# Patient Record
Sex: Female | Born: 1945 | Hispanic: Yes | Marital: Married | Smoking: Never smoker
Health system: Southern US, Community
[De-identification: ages and names within clinical notes are randomized; demographics above are authoritative.]

## PROBLEM LIST (undated history)

## (undated) DIAGNOSIS — Z8379 Family history of other diseases of the digestive system: Secondary | ICD-10-CM

## (undated) DIAGNOSIS — R112 Nausea with vomiting, unspecified: Secondary | ICD-10-CM

## (undated) DIAGNOSIS — K219 Gastro-esophageal reflux disease without esophagitis: Secondary | ICD-10-CM

## (undated) DIAGNOSIS — R1013 Epigastric pain: Secondary | ICD-10-CM

## (undated) HISTORY — PX: OTHER SURGICAL HISTORY: SHX169

## (undated) HISTORY — DX: Family history of other diseases of the digestive system: Z83.79

## (undated) HISTORY — PX: CHOLECYSTECTOMY: SHX55

## (undated) HISTORY — DX: Nausea with vomiting, unspecified: R11.2

## (undated) HISTORY — DX: Epigastric pain: R10.13

## (undated) HISTORY — DX: Gastro-esophageal reflux disease without esophagitis: K21.9

---

## 2005-10-31 ENCOUNTER — Emergency Department (HOSPITAL_COMMUNITY): Admission: EM | Admit: 2005-10-31 | Discharge: 2005-10-31 | Payer: Self-pay | Admitting: Emergency Medicine

## 2007-11-06 ENCOUNTER — Ambulatory Visit (HOSPITAL_COMMUNITY): Admission: RE | Admit: 2007-11-06 | Discharge: 2007-11-06 | Payer: Self-pay | Admitting: Family Medicine

## 2008-01-19 ENCOUNTER — Encounter (HOSPITAL_COMMUNITY): Admission: RE | Admit: 2008-01-19 | Discharge: 2008-02-18 | Payer: Self-pay | Admitting: Family Medicine

## 2008-01-19 HISTORY — PX: OTHER SURGICAL HISTORY: SHX169

## 2008-02-01 ENCOUNTER — Ambulatory Visit: Payer: Self-pay | Admitting: Urgent Care

## 2008-10-11 IMAGING — US US ABDOMEN COMPLETE
1 series · 14 of 25 positions shown · non-contrast
Comparison: None

CLINICAL DATA: Epigastric pain, nausea, vomiting

ABDOMEN ULTRASOUND
TECHNIQUE: Complete abdominal ultrasound examination was performed
including evaluation of the liver, gallbladder, bile ducts,
pancreas, kidneys, spleen, IVC, and abdominal aorta.

[Series 1: us abdomen complete · 0.30mm/px · 14 of 62 slices shown]
[im 1/62]
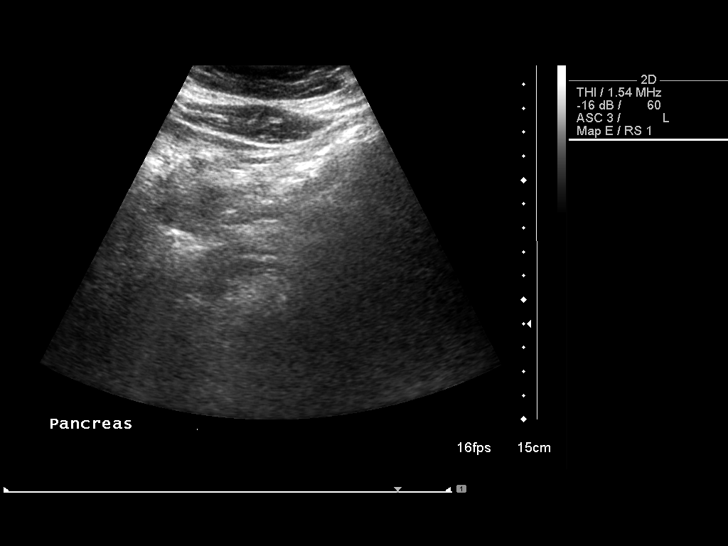
[im 6/62]
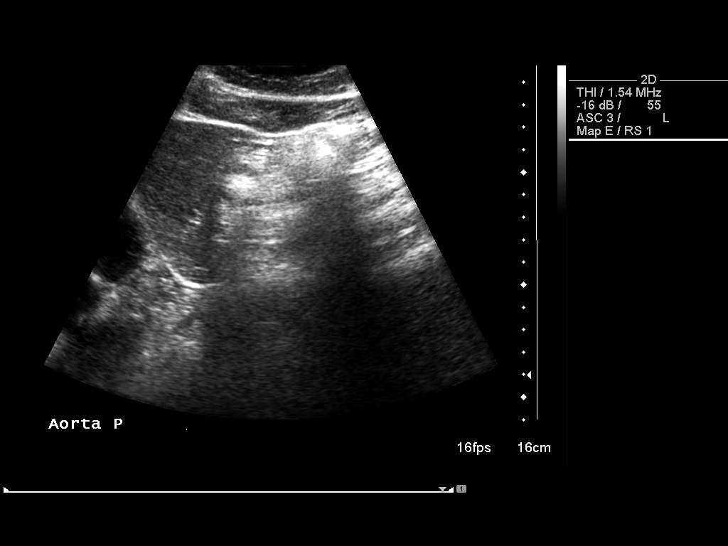
[im 11/62]
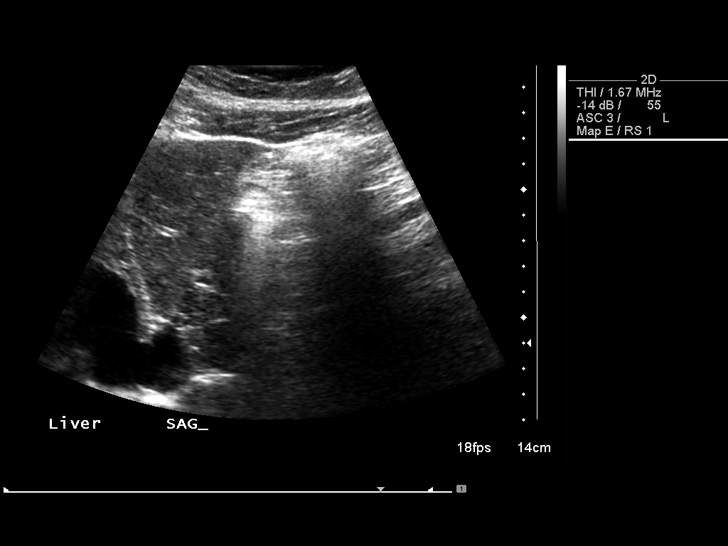
[im 16/62]
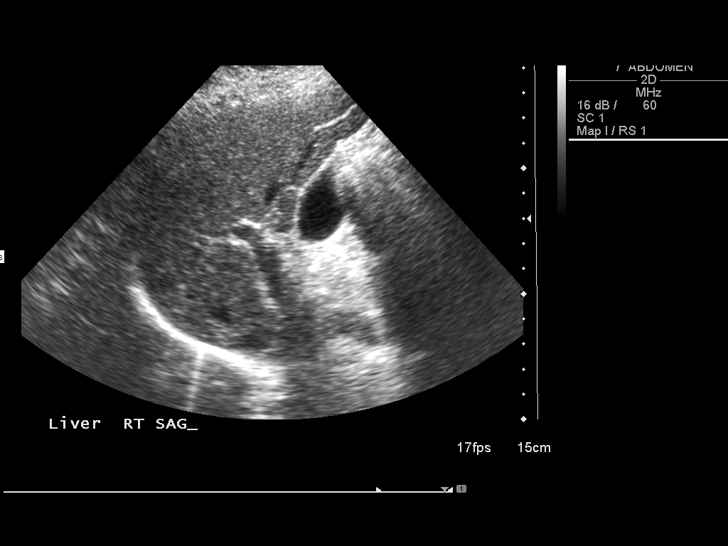
[im 21/62]
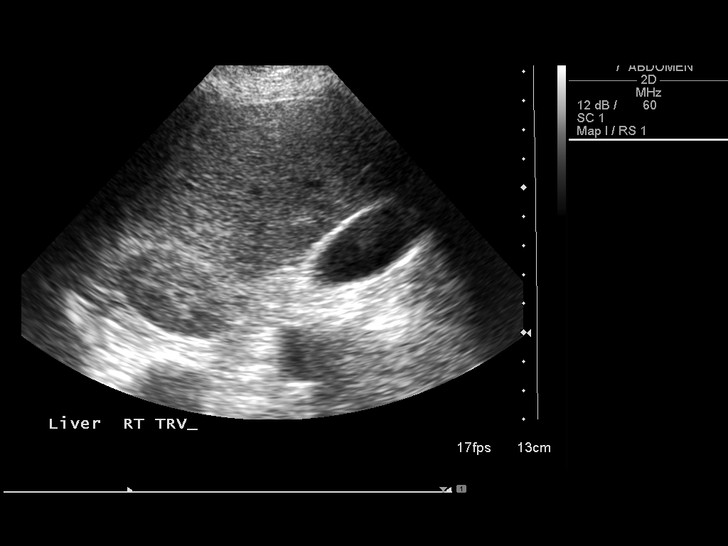
[im 23/62]
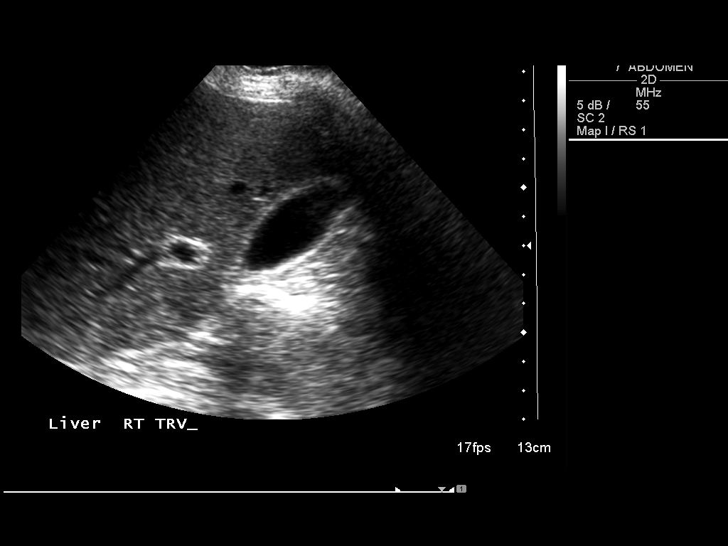
[im 28/62]
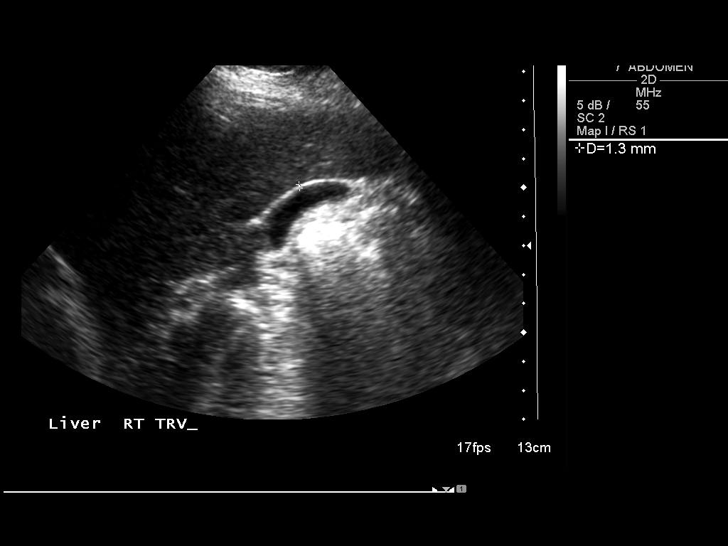
[im 34/62]
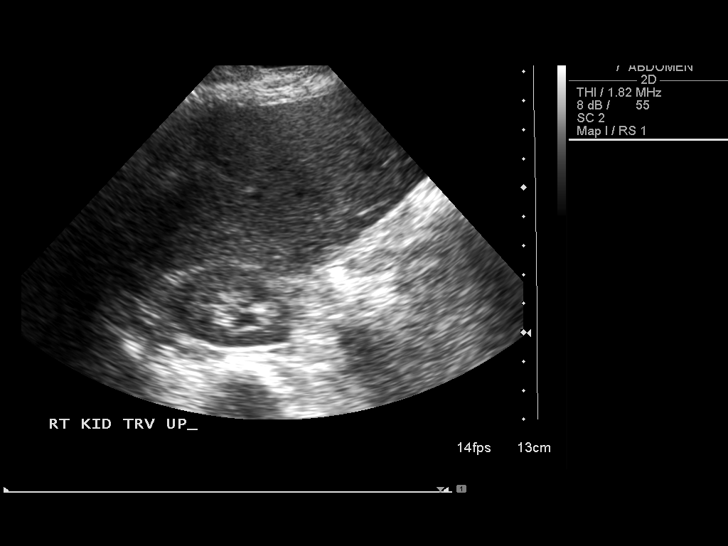
[im 39/62]
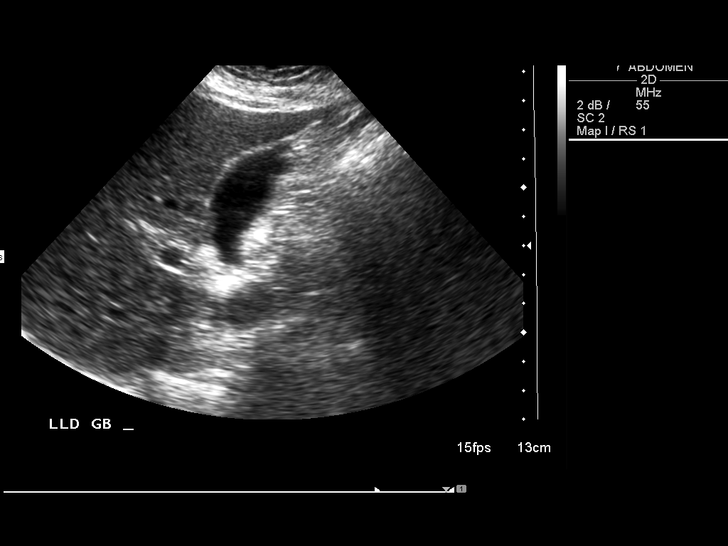
[im 41/62]
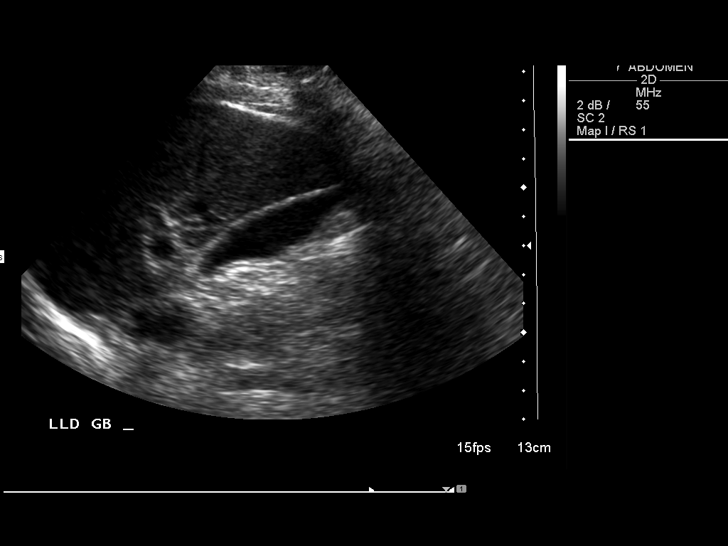
[im 46/62]
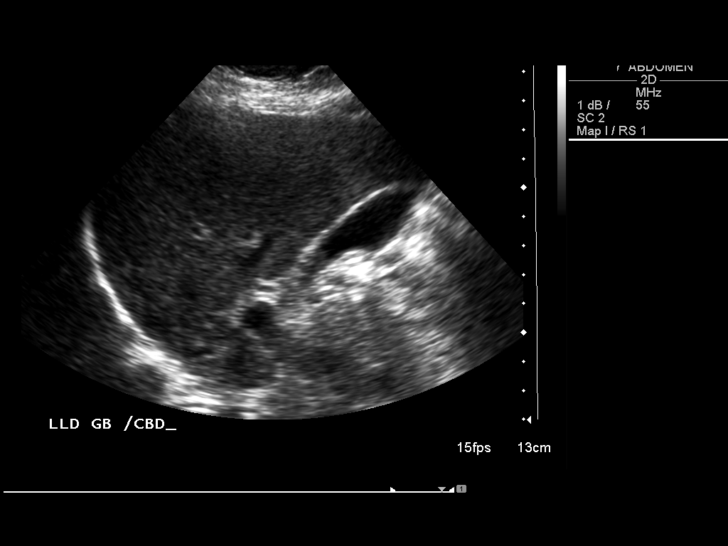
[im 51/62]
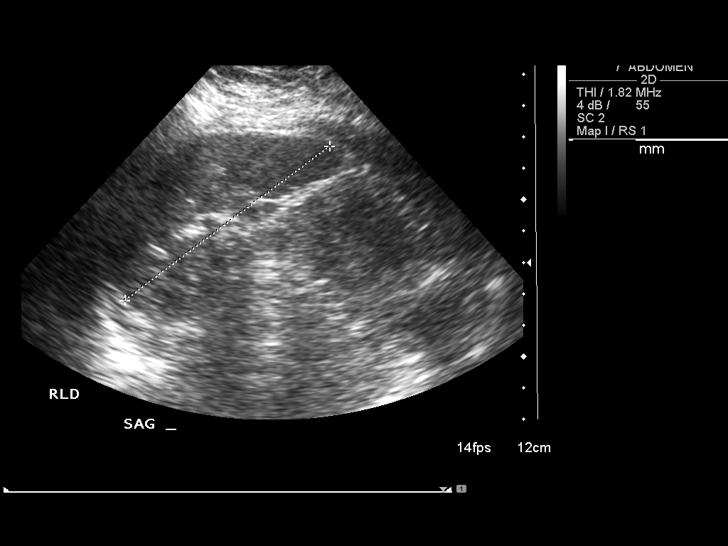
[im 56/62]
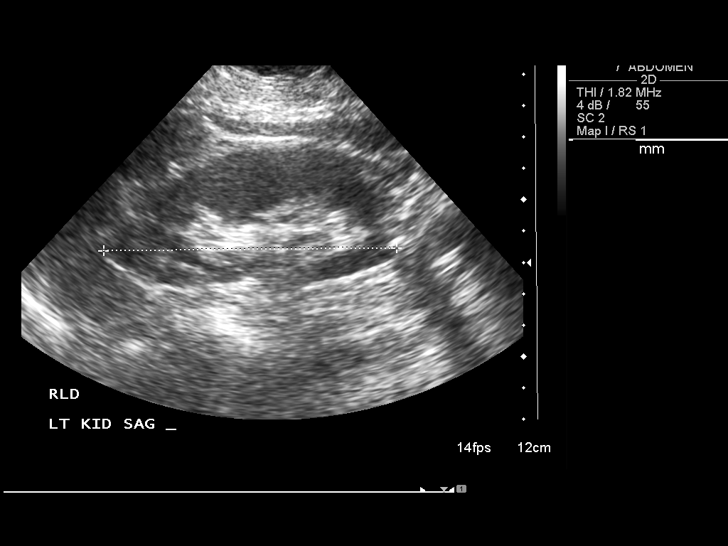
[im 62/62]
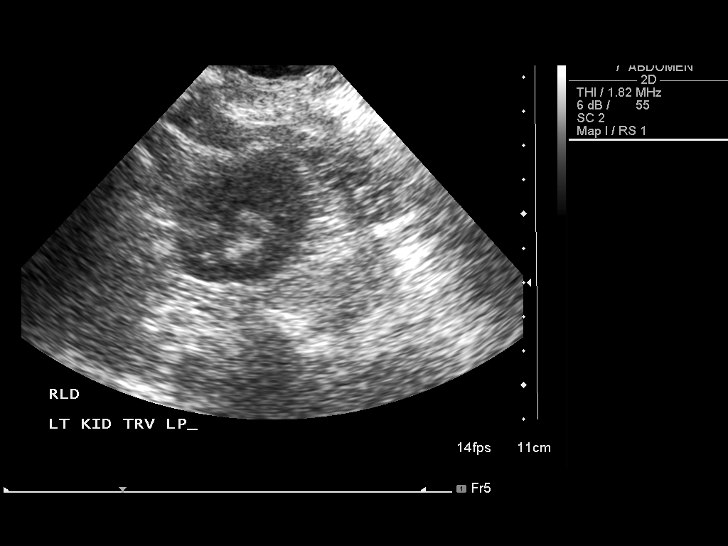

[14 of 25 positions shown; findings below may reference images not displayed]

FINDINGS: Gallbladder normal without stones or wall thickening.
Common bile duct normal caliber 3 mm diameter.
Inadequate pancreatic evaluation due to bowel gas.
Liver and spleen unremarkable, spleen, 8.2 cm length.
Kidneys normal size and morphology, 9.3 cm length bilaterally.
Aorta and IVC normal.
No free fluid.
IMPRESSION: Inadequate pancreatic visualization.
Otherwise normal exam.

## 2008-12-24 IMAGING — NM NM HEPATO W/GB/PHARM/[PERSON_NAME]
2 series · 12 of 12 positions shown · non-contrast
Comparison: None

CLINICAL DATA: Abdominal pain, nausea, vomiting

NUCLEAR MEDICINE HEPATOBILIARY IMAGING WITH GALLBLADDER EF:
TECHNIQUE: Sequential images of the abdomen were obtained for 60
minutes following intravenous administration of
radiopharmaceutical.  After standardized fatty meal challenge
utilizing 8 ounces of Half-and-Half, imaging was continued for an
additional 60 minutes.  Gallbladder ejection fraction was
calculated from quantitative analysis of the post fatty meal
images.
Radiopharmaceutical:  5 mCi 2c-JJm mebrofenin

[Series 1: hepatobiliary · 3.20mm/px · 6 of 60 frames shown (1 of 2)]
[frame 6/60]
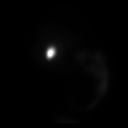
[frame 16/60]
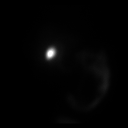
[frame 26/60]
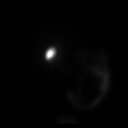
[frame 36/60]
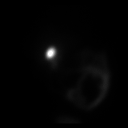
[frame 46/60]
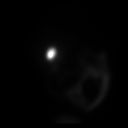
[frame 56/60]
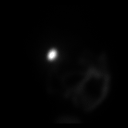

[Series 1: hepatobiliary · 3.20mm/px · 6 of 60 frames shown (2 of 2)]
[frame 6/60]
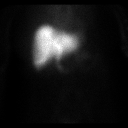
[frame 16/60]
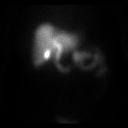
[frame 26/60]
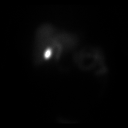
[frame 36/60]
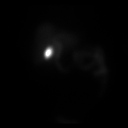
[frame 46/60]
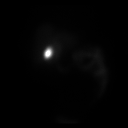
[frame 56/60]
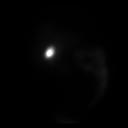

[12 of 12 positions shown; findings below may reference images not displayed]

FINDINGS: Prompt tracer extraction from bloodstream, indicating normal
hepatocellular function.
Prompt excretion of tracer into biliary tree.
Gallbladder visualized at 16 minutes.
Small bowel visualized at 10 minutes.
No hepatic retention of tracer.

No significant emptying of tracer occurs from gallbladder following
fatty meal stimulation.
Calculated gallbladder ejection fraction is essentially 0%,
markedly abnormal.
IMPRESSION: Patent biliary tree.
No significant emptying of tracer from the gallbladder following
fatty meal stimulation, indicating significant gallbladder
dysfunction.

## 2009-04-10 DIAGNOSIS — K219 Gastro-esophageal reflux disease without esophagitis: Secondary | ICD-10-CM | POA: Insufficient documentation

## 2009-04-10 DIAGNOSIS — R1013 Epigastric pain: Secondary | ICD-10-CM | POA: Insufficient documentation

## 2009-04-10 DIAGNOSIS — R112 Nausea with vomiting, unspecified: Secondary | ICD-10-CM | POA: Insufficient documentation

## 2009-04-11 ENCOUNTER — Ambulatory Visit: Payer: Self-pay | Admitting: Internal Medicine

## 2009-04-11 ENCOUNTER — Encounter (INDEPENDENT_AMBULATORY_CARE_PROVIDER_SITE_OTHER): Payer: Self-pay | Admitting: *Deleted

## 2009-04-11 DIAGNOSIS — K828 Other specified diseases of gallbladder: Secondary | ICD-10-CM

## 2009-04-12 ENCOUNTER — Encounter: Payer: Self-pay | Admitting: Gastroenterology

## 2009-04-21 ENCOUNTER — Ambulatory Visit: Payer: Self-pay | Admitting: Gastroenterology

## 2009-04-21 ENCOUNTER — Ambulatory Visit (HOSPITAL_COMMUNITY): Admission: RE | Admit: 2009-04-21 | Discharge: 2009-04-21 | Payer: Self-pay | Admitting: Gastroenterology

## 2009-04-21 ENCOUNTER — Encounter: Payer: Self-pay | Admitting: Gastroenterology

## 2009-04-21 HISTORY — PX: OTHER SURGICAL HISTORY: SHX169

## 2009-05-03 ENCOUNTER — Encounter: Payer: Self-pay | Admitting: Gastroenterology

## 2010-09-18 ENCOUNTER — Other Ambulatory Visit (HOSPITAL_COMMUNITY): Payer: Self-pay | Admitting: Family Medicine

## 2010-09-18 DIAGNOSIS — Z139 Encounter for screening, unspecified: Secondary | ICD-10-CM

## 2010-09-19 ENCOUNTER — Encounter (INDEPENDENT_AMBULATORY_CARE_PROVIDER_SITE_OTHER): Payer: Self-pay | Admitting: *Deleted

## 2010-09-24 ENCOUNTER — Ambulatory Visit (HOSPITAL_COMMUNITY)
Admission: RE | Admit: 2010-09-24 | Discharge: 2010-09-24 | Disposition: A | Discharge status: Home / Self Care | Payer: 59 | Source: Ambulatory Visit | Attending: Family Medicine | Admitting: Family Medicine

## 2010-09-24 DIAGNOSIS — Z1231 Encounter for screening mammogram for malignant neoplasm of breast: Secondary | ICD-10-CM | POA: Insufficient documentation

## 2010-09-24 DIAGNOSIS — Z139 Encounter for screening, unspecified: Secondary | ICD-10-CM

## 2010-09-27 NOTE — Letter (Signed)
Summary: Unable to Reach, Consult Scheduled  [facility]  [address]   [place], [place] [address]   Phone: [phone]  Fax: [phone]    09/19/2010  [facility] [address] [place], [place]  [address] 1945/10/20   Dear Ms. Pavelko,     At the recommendation of DR [facility]  we have been asked to schedule you a consult with [facility] for GERD/GALLBLADDER DYSFUNCTION.  Your appointment is scheduled for [facility],2012 AT 11am. If you are unable to keep this appointment, or if you have any questions,  please call our office at [phone].     Thank you,    Diana Eves  [facility] R. Roetta Sessions, M.D.    Jonette Eva, M.D. Lorenza Burton, FNP-BC    Tana Coast, PA-C Phone: [phone]    Fax: [phone]

## 2010-10-05 ENCOUNTER — Encounter: Payer: Self-pay | Admitting: Urgent Care

## 2010-10-05 ENCOUNTER — Ambulatory Visit (INDEPENDENT_AMBULATORY_CARE_PROVIDER_SITE_OTHER): Payer: 59 | Admitting: Urgent Care

## 2010-10-05 DIAGNOSIS — K828 Other specified diseases of gallbladder: Secondary | ICD-10-CM

## 2010-10-05 DIAGNOSIS — K219 Gastro-esophageal reflux disease without esophagitis: Secondary | ICD-10-CM

## 2010-10-05 DIAGNOSIS — R1013 Epigastric pain: Secondary | ICD-10-CM

## 2010-10-08 ENCOUNTER — Encounter: Payer: Self-pay | Admitting: Internal Medicine

## 2010-10-09 NOTE — Assessment & Plan Note (Signed)
Summary: gerd,unspec gallbladder  dysfunction   Vital Signs:  Patient profile:   65 year old female Height:      57.5 inches Weight:      122 pounds BMI:     26.04 Temp:     97.9 degrees F oral Pulse rate:   60 / minute BP sitting:   156 / 100  (left arm)  Vitals Entered By: Carolan Clines LPN (October 05, 2010 11:17 AM)  Visit Type:  Follow-up Visit Primary Care Provider:  [facility]   History of Present Illness: Larna Daughters [phone] 65 y/o hispanic female here for FU GERD w/ her husband.  Having problems if she does not take her omeprazole 20mg  two times a day.   Denies any heartburn or indigestion.  Would like to have cholecystectomy now, as she is convinved that she is having gallbladder problems.  She had a HIDA scan on 01/19/2008, which showed no significant emptying or tracer from the gallbladder following fatty meal simulation, calculated gallbladder ejection fraction was essentially zero, abnormal.  She was not augmented by morphine.  c/o pain & nausea after eating.  Vomited a couple times after eating.  Occ drinks coffee or has a soda.  Denies melena, rectal bleeding, diarrhea or constipation.      Current Medications (verified): 1)  Omeprazole 20 Mg Tbec (Omeprazole) .... Two Times A Day  Allergies (verified): No Known Drug Allergies  Past History:  Past Surgical History: Last updated: 04/10/2009 C-SECTION WITH DEHISCENCE  Family History: Last updated: 04/11/2009 Colorectal CA in 1 brother alcohol cirrhosis in father Mother (deceased 107) CAD 6 siblings healthy  Social History: Last updated: 04/11/2009 married 8 healthy children works Research officer, trade union Patient has never smoked.  Alcohol Use - no Illicit Drug Use - no  Past Medical History: Family Hx of CIRRHOSIS, ALCOHOLIC (ICD-571.2) Family Hx of CARCINOMA, COLORECTAL (ICD-153.9) Hx of NAUSEA AND VOMITING (ICD-787.01) EPIGASTRIC PAIN (ICD-789.06) GERD (ICD-530.81) 10/10 EGD/colonoscopy w/ Dr  [place] Pink TI, approx 5cm visualized, Normal colon without evidence of polyps, masses, inflammatory changes, diverticula or AVMs, small int hemorrhoids.  Normal esophagus without evidence of Barrett, mass, erosion, ulceration or stricture, HH, approx 2-3 cm. Occasional patchy erythema in the mid body, normal duodenal bulb and second portion of the duodenum. ABNL HIDA 01/19/08  Family History: Reviewed history from 04/11/2009 and no changes required. Colorectal CA in 1 brother alcohol cirrhosis in father Mother (deceased 62) CAD 6 siblings healthy  Social History: Reviewed history from 04/11/2009 and no changes required. 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  Alcohol Use - no Illicit Drug Use - no  Review of Systems General:  Denies fever, chills, sweats, anorexia, fatigue, weakness, malaise, weight loss, and sleep disorder. CV:  Denies chest pains, angina, palpitations, syncope, dyspnea on exertion, orthopnea, PND, peripheral edema, and claudication. Resp:  Denies dyspnea at rest, dyspnea with exercise, cough, sputum, wheezing, coughing up blood, and pleurisy. GI:  See HPI; Denies difficulty swallowing, pain on swallowing, jaundice, and fecal incontinence. GU:  Denies urinary burning, blood in urine, nocturnal urination, urinary frequency, and urinary incontinence. MS:  Denies joint pain / LOM, joint swelling, joint stiffness, joint deformity, low back pain, muscle weakness, muscle cramps, muscle atrophy, leg pain at night, leg pain with exertion, and shoulder pain / LOM hand / wrist pain (CTS). Derm:  Denies rash, itching, dry skin, hives, moles, warts, and unhealing ulcers. Psych:  Denies depression, anxiety, memory loss, suicidal ideation, hallucinations, paranoia, phobia, and confusion.  Heme:  Denies bruising, bleeding, and enlarged lymph nodes.  Physical Exam  General:  Well developed, well nourished, no acute distress. Head:  Normocephalic  and atraumatic. Eyes:  Sclera clear, no icterus. Ears:  Normal auditory acuity. Nose:  No deformity, discharge,  or lesions. Mouth:  No deformity or lesions, dentition normal. Neck:  Supple; no masses or thyromegaly. Heart:  Regular rate and rhythm; no murmurs, rubs,  or bruits. Abdomen:  Soft, nontender and nondistended. No masses, hepatosplenomegaly or hernias noted. Normal bowel sounds.without guarding and without rebound.  without guarding and without rebound.   Msk:  Symmetrical with no gross deformities. Normal posture. Pulses:  Normal pulses noted. Extremities:  No clubbing, cyanosis, edema or deformities noted. Neurologic:  Alert and  oriented x4;  grossly normal neurologically. Skin:  Intact without significant lesions or rashes. Cervical Nodes:  No significant cervical adenopathy. Inguinal Nodes:  No significant inguinal adenopathy. Psych:  Alert and cooperative. Normal mood and affect.   Impression & Recommendations:  Problem # 1:  GERD (ICD-530.81)  65 y/o hispanic female w/ GERD, doing well on PPI.  She also has bouts of eopigastric pain, nausea & vomiting post-prandially.  She has done well, however on PPI.  She is adamant about wanting cholecystectomy now given her abnormal HIDA back in 2009.  I explained to her that she may have 2 problems, GERD & biliary dyskinesia.  I offered rechecking gallbladder given her HIDA was almost 66 yrs old w/ abd [place] and HIDA, however she was resistant to this.  In the end, we decided to send her to general surgery who can determine whether she would need any further work-up prior to cholecystectomy.  She agreed w/ this plan.  I also explained that she may need chronic PPI given her GERD despite possible cholecystectomy.    Orders: Est. Patient Level III (62952)  Problem # 2:  BILIARY DYSKINESIA (ICD-575.8) See #1  Patient Instructions: 1)  Continue omeprazole 20mg  BID   Orders Added: 1)  Est. Patient Level III [84132]

## 2010-10-18 NOTE — Letter (Signed)
Summary: SURGICAL REFERRAL  SURGICAL REFERRAL   Imported By: Ave Filter 10/08/2010 08:26:46  _____________________________________________________________________  External Attachment:    Type:   Image     Comment:   External Document  Appended Document: SURGICAL REFERRAL Pt has an appt. 10/11/10@9 :30am  Appended Document: SURGICAL REFERRAL Pt's husband aware of appt.

## 2010-10-31 ENCOUNTER — Other Ambulatory Visit: Payer: Self-pay | Admitting: General Surgery

## 2010-10-31 ENCOUNTER — Encounter (HOSPITAL_COMMUNITY): Payer: 59

## 2010-10-31 LAB — BASIC METABOLIC PANEL
BUN: 18 mg/dL (ref 6–23)
CO2: 26 mEq/L (ref 19–32)
Calcium: 9.7 mg/dL (ref 8.4–10.5)
Chloride: 106 mEq/L (ref 96–112)
GFR calc non Af Amer: >60 mL/min (ref >60)
Glucose, Bld: 74 mg/dL (ref 70–99)
Potassium: 4.6 mEq/L (ref 3.5–5.1)

## 2010-10-31 LAB — HEPATIC FUNCTION PANEL
AST: 21 U/L (ref 0–37)
Albumin: 4 g/dL (ref 3.5–5.2)
Bilirubin, Direct: 0.1 mg/dL (ref 0.0–0.3)
Indirect Bilirubin: 0.5 mg/dL (ref 0.3–0.9)
Total Bilirubin: 0.6 mg/dL (ref 0.3–1.2)
Total Protein: 7.4 g/dL (ref 6.0–8.3)

## 2010-10-31 LAB — SURGICAL PCR SCREEN
MRSA, PCR: NEGATIVE
Staphylococcus aureus: NEGATIVE

## 2010-10-31 LAB — CBC
HCT: 42.4 % (ref 36.0–46.0)
Hemoglobin: 14.1 g/dL (ref 12.0–15.0)
MCHC: 33.3 g/dL (ref 30.0–36.0)
RDW: 12.7 % (ref 11.5–15.5)

## 2010-11-05 ENCOUNTER — Other Ambulatory Visit: Payer: Self-pay | Admitting: General Surgery

## 2010-11-05 ENCOUNTER — Ambulatory Visit (HOSPITAL_COMMUNITY)
Admission: RE | Admit: 2010-11-05 | Discharge: 2010-11-05 | Disposition: A | Discharge status: Home / Self Care | Payer: 59 | Source: Ambulatory Visit | Attending: General Surgery | Admitting: General Surgery

## 2010-11-05 DIAGNOSIS — Z01812 Encounter for preprocedural laboratory examination: Secondary | ICD-10-CM | POA: Insufficient documentation

## 2010-11-05 DIAGNOSIS — K811 Chronic cholecystitis: Secondary | ICD-10-CM | POA: Insufficient documentation

## 2010-11-09 NOTE — Op Note (Signed)
April Archer, April Archer                ACCOUNT NO.:  [id number]  MEDICAL RECORD NO.:  [id number]           PATIENT TYPE:  O  LOCATION:  [facility]                          FACILITY:  [facility]  PHYSICIAN:  Dalia Heading, M.D.  DATE OF BIRTH:  1946/03/04  DATE OF PROCEDURE:  11/05/2010 DATE OF DISCHARGE:                              OPERATIVE REPORT   AGE:  64 years old.  PREOPERATIVE DIAGNOSIS:  Chronic cholecystitis.  POSTOPERATIVE DIAGNOSIS:  Chronic cholecystitis.  PROCEDURE:  Laparoscopic cholecystectomy.  SURGEON:  Dalia Heading, MD  ANESTHESIA:  General endotracheal.  INDICATIONS:  The patient is a 65 year old Hispanic female who presents with biliary colic secondary to chronic cholecystitis.  The risks and benefits of the procedure including bleeding, infection, hepatobiliary injury, the possibly of an open procedure were fully explained to the patient,  gave informed consent.  PROCEDURE NOTE:  The patient was placed in supine position.  After induction of general endotracheal anesthesia, the abdomen was prepped and draped in the usual sterile technique with DuraPrep.  Surgical site confirmation was performed.  A supraumbilical incision was made down to the fascia.  A Veress needle was introduced into the abdominal cavity and confirmation of placement was done using the saline drop test.  The abdomen was then insufflated to 16 mmHg pressure.  A 11-mm trocar was introduced into the abdominal cavity under direct visualization without difficulty.  The patient was placed in reversed Trendelenburg position.  Additional 11-mm trocar was placed in the epigastric region and 5-mm trocars placed in the right upper quadrant and right flank regions.  The liver was inspected and noted to have [employer] bands.  These were lysed sharply without difficulty.  The gallbladder was then retracted superior and laterally.  Dissection was begun around the infundibulum of  the gallbladder.  The cystic duct was first identified.  Juncture to the infundibulum fully identified.  Endoclips placed proximally and distally on the cystic duct and cystic duct was divided.  This likewise was done on cystic artery.  The gallbladder then freed away from the gallbladder fossa using Bovie electrocautery.  There was some spillage of bile.  The gallbladder was then delivered through the epigastric trocar site using EndoCatch bag.  The gallbladder fossa was inspected and no abnormal bleeding or bile leakage was noted.  Surgicel snow was then placed into this region.  All fluid and air was then evacuated from the abdominal cavity prior to removal of the trocars.  All wounds were irrigated with normal saline.  All wounds were injected with 0.5% Sensorcaine.  The supraumbilical fascia as well as epigastric fascia reapproximated using 0 Vicryl interrupted sutures.  All skin incisions were closed using staples.  Betadine ointment and dry sterile dressings were applied.  All tape and needle counts were correct at the end of procedure.  The patient was extubated in the operating room, went back to recovery room, awake in stable condition.  COMPLICATIONS:  None.  SPECIMEN:  Gallbladder.  BLOOD LOSS:  Minimal.     Dalia Heading, M.D.     MAJ/MEDQ  D:  11/05/2010  T:  11/05/2010  Job:  [id number]  cc:   R. Roetta Sessions, M.D. [address] [place] [address]  [facility]  Electronically Signed by Franky Macho M.D. on 11/09/2010 09:19:28 AM

## 2010-12-04 NOTE — Assessment & Plan Note (Signed)
NAMETASHEMA, TILLER                 CHART#:  [id number]   DATE:  02/01/2008                       DOB:  [birth year]   REQUESTING PHYSICIAN:  Patrica Duel, MD   REASON FOR CONSULTATION:  Epigastric pain, nausea, and vomiting.   HISTORY OF PRESENT ILLNESS:  The patient is a 65 year old [place]  female.  She has had a 47-month history of intermittent abdominal pain,  which is quite severe.  She points to her epigastric region.  She has  had vomiting with this.  She tells me symptoms are definitely worse when  she eats especially certain things like drinking coffee or drinking  coke.  She complains of early satiety as well.  Her appetite is somewhat  diminished.  Her weight has been stable.  She denies any dysphagia or  odynophagia.  She generally has a soft brown daily bowel movement.  Denies constipation or diarrhea.  Denies any rectal bleeding, melena,  fever, or chills.   She did have LFTs, which were normal as well as a normal amylase and  lipase.  She had a normal TSH and normal CBC on 01/14/2008.  She has  tried omeprazole 20 mg b.i.d., and it does not seem to affect the pain  at all.  She had a HIDA scan on 01/19/2008, which shows no significant  emptying or tracer from the gallbladder following fatty meal simulation.  Calculated gallbladder ejection fraction is essentially zero, which is  markedly abnormal.   PAST MEDICAL AND SURGICAL HISTORY:  She has had a C-section with  dehiscence.   CURRENT MEDICATIONS:  Omeprazole 20 mg b.i.d.   ALLERGIES:  No known drug allergies.   FAMILY HISTORY:  Positive for colorectal carcinoma in a brother and  alcoholic cirrhosis in her father.  Her mother passed away at age 31  secondary to coronary artery disease.  She has 6 siblings.   SOCIAL HISTORY:  The patient is married.  She has 8 healthy children.  She works at [employer]  She denies any tobacco, alcohol, or drug use.   REVIEW OF SYSTEMS:  See HPI.   PHYSICAL EXAMINATION:   VITAL SIGNS:  Weight 127 pounds, height 62  inches, temperature 98.1, blood pressure 128/82, and pulse of 86.  GENERAL:  The patient is a well-developed, well-nourished [place]  female, who is alert, oriented, pleasant, and cooperative.  She speaks  limited [place], and her husband is at her side. HEENT:  Sclerae clear,  nonicteric.  Conjunctivae pink.  Oropharynx pink and moist without  lesions. NECK:  Supple without masses or thyromegaly. CHEST:  Heart,  regular rate and rhythm.  Normal S1, S2.  No murmurs, clicks, rubs, or  gallops. LUNGS:  Clear to auscultation bilaterally.  ABDOMEN:  Positive bowel sounds x4.  No bruits auscultated.  She does  have significant Murphy point tenderness.  There is no rebound,  tenderness or guarding.  No hepatosplenomegaly or mass. EXTREMITIES:  Without clubbing or edema. SKIN:  Brown, warm, and dry without any rash  or jaundice.   IMPRESSION:  Ms. Maultsby is a 65 year old female with a 68-month history of  epigastric pain and a significantly abnormal HIDA scan, indicating  significant gallbladder dysfunction and severe biliary dyskinesia, most  likely chronic cholecystitis as a culprit of her pain.  Discussed the  case with Dr. Cira Servant,  and I plan the care as outlined below.   She also is overdue for screening colonoscopy, and she may need EGD if  her abdominal pain persists post cholecystectomy.   PLAN:  1. I have discussed with Margarita Mail, PA-C at [facility], she will set up a surgical referral.  2. Office visit in 4 weeks to discuss setting up screening      colonoscopy, +/- diagnostic EGD if her symptoms persist.   Thank you Dr. Nobie Putnam for asking [place] to participate in the care of the  patient.       Lorenza Burton, N.P.  Electronically Signed     Kassie Mends, M.D.  Electronically Signed    KJ/MEDQ  D:  02/02/2008  T:  02/03/2008  Job:  [id number]   cc:   Patrica Duel, M.D.

## 2011-02-04 NOTE — H&P (Signed)
April Archer, April Archer                ACCOUNT NO.:  [id number]  MEDICAL RECORD NO.:  [id number]  LOCATION:                                 FACILITY:  PHYSICIAN:  Dalia Heading, M.D.  DATE OF BIRTH:  03/22/1946  DATE OF ADMISSION: DATE OF DISCHARGE:  LH                             HISTORY & PHYSICAL   CHIEF COMPLAINT:  Chronic cholecystitis.  HISTORY OF PRESENT ILLNESS:  The patient is a 65 year old Hispanic female who is referred for evaluation and treatment of biliary colic and GERD.  It has been present intermittently for sometime now, it has been increasing in severity.  She does have intermittent nausea, GERD, fatty food intolerance, and right upper quadrant abdominal pain.  Proton pump inhibitors have not been helpful.  No fever, chills, or jaundice have been noted.  PAST MEDICAL HISTORY:  As noted above.  PAST SURGICAL HISTORY:  Hysterectomy.  CURRENT MEDICATIONS:  Omeprazole 20 mg p.o. b.i.d.  ALLERGIES:  No known drug allergies.  REVIEW OF SYSTEMS:  The patient denies drinking or smoking.  She denies any other cardiopulmonary difficulties or bleeding disorders.  FAMILY MEDICAL HISTORY:  Noncontributory.  PHYSICAL EXAMINATION:  GENERAL:  The patient is a well-developed, well- nourished Hispanic female in no acute distress. HEENT:  No scleral icterus. LUNGS:  Clear to auscultation with equal breath sounds bilaterally. HEART:  Regular rate and rhythm without S3, S4, or murmurs. ABDOMEN:  Soft with slight tenderness noted on the right upper quadrant to palpation.  No hepatosplenomegaly, masses, or hernias are identified.  HIDA scan in the past revealed 0 gallbladder ejection fraction.  IMPRESSION:  Chronic cholecystitis.  PLAN:  The patient is scheduled for laparoscopic cholecystectomy on November 05, 2010.  Risks and benefits of the procedure including bleeding, infection, hepatobiliary, the possibility of an open procedure were fully explained to the patient, gave  informed consent.     Dalia Heading, M.D.     MAJ/MEDQ  D:  10/18/2010  T:  10/19/2010  Job:  [id number]  cc:   R. Roetta Sessions, MD FACP [facility] [address] [place] [address]  Short Stay at [facility] [address] [place], [place] [address]

## 2012-08-11 ENCOUNTER — Other Ambulatory Visit (HOSPITAL_COMMUNITY): Payer: Self-pay | Admitting: Family Medicine

## 2012-08-11 DIAGNOSIS — Z139 Encounter for screening, unspecified: Secondary | ICD-10-CM

## 2012-08-13 ENCOUNTER — Ambulatory Visit (HOSPITAL_COMMUNITY): Payer: 59

## 2012-08-13 ENCOUNTER — Ambulatory Visit (HOSPITAL_COMMUNITY)
Admission: RE | Admit: 2012-08-13 | Discharge: 2012-08-13 | Disposition: A | Discharge status: Home / Self Care | Payer: 59 | Source: Ambulatory Visit | Attending: Family Medicine | Admitting: Family Medicine

## 2012-08-13 DIAGNOSIS — Z1231 Encounter for screening mammogram for malignant neoplasm of breast: Secondary | ICD-10-CM | POA: Insufficient documentation

## 2012-08-13 DIAGNOSIS — Z139 Encounter for screening, unspecified: Secondary | ICD-10-CM

## 2014-03-10 DIAGNOSIS — Z131 Encounter for screening for diabetes mellitus: Secondary | ICD-10-CM | POA: Diagnosis not present

## 2014-03-10 DIAGNOSIS — I10 Essential (primary) hypertension: Secondary | ICD-10-CM | POA: Diagnosis not present

## 2014-09-19 DIAGNOSIS — Z6824 Body mass index (BMI) 24.0-24.9, adult: Secondary | ICD-10-CM | POA: Diagnosis not present

## 2014-09-19 DIAGNOSIS — I10 Essential (primary) hypertension: Secondary | ICD-10-CM | POA: Diagnosis not present

## 2014-09-19 DIAGNOSIS — K5289 Other specified noninfective gastroenteritis and colitis: Secondary | ICD-10-CM | POA: Diagnosis not present

## 2014-11-03 ENCOUNTER — Encounter: Payer: Self-pay | Admitting: Gastroenterology

## 2014-11-25 ENCOUNTER — Encounter (INDEPENDENT_AMBULATORY_CARE_PROVIDER_SITE_OTHER): Payer: Self-pay

## 2014-11-25 ENCOUNTER — Ambulatory Visit (INDEPENDENT_AMBULATORY_CARE_PROVIDER_SITE_OTHER): Payer: 59 | Admitting: Gastroenterology

## 2014-11-25 ENCOUNTER — Encounter: Payer: Self-pay | Admitting: Gastroenterology

## 2014-11-25 VITALS — BP 159/76 | HR 62 | Temp 97.4°F | Ht 59.0 in | Wt 121.0 lb

## 2014-11-25 DIAGNOSIS — R112 Nausea with vomiting, unspecified: Secondary | ICD-10-CM | POA: Diagnosis not present

## 2014-11-25 DIAGNOSIS — R197 Diarrhea, unspecified: Secondary | ICD-10-CM

## 2014-11-25 NOTE — Progress Notes (Signed)
      Primary Care Physician:  Toma Deiters, MD Primary Gastroenterologist:  Dr. Darrick Penna   Chief Complaint  Patient presents with  . Nausea    HPI:   April Archer is a 69 y.o. female presenting today at the request of Dr. Olena Leatherwood secondary to possible blood in stools. Present with her husband today. Limited English, but husband is able to give majority of information.   Thinks she ate "bad" pancakes. Several weeks ago. Stomach felt unsettled, had diarrhea, nausea and vomiting. Lost 5 lbs in 1 week. Regained weight. Symptoms resolved. No fever/chills. No abdominal pain now. No N/V. States stool was black with episode. Difficult to describe. No bright red blood.   Last colonoscopy in 2010 with small internal hemorrhoids otherwise normal.   Past Medical History  Diagnosis Date  . FH: cirrhosis     ALCOHOLIC  . Nausea & vomiting   . Epigastric pain   . GERD (gastroesophageal reflux disease)     Past Surgical History  Procedure Laterality Date  . C-section with dehiscence    . Egd/tcs  04/2009    Dr. Darrick Penna: small internal hemorrhoids, otherwise normal. EGD normal esophagus, mild gastritis, benign  . Abnormal hida  01/19/08  . Cholecystectomy      Current Outpatient Prescriptions  Medication Sig Dispense Refill  . losartan-hydrochlorothiazide (HYZAAR) 50-12.5 MG per tablet Take 1 tablet by mouth daily.  0  . Multiple Vitamin (MULTIVITAMIN) capsule Take 1 capsule by mouth daily.    Marland Kitchen omeprazole (PRILOSEC) 20 MG capsule Take 20 mg by mouth 2 (two) times daily.       No current facility-administered medications for this visit.    Allergies as of 11/25/2014  . (No Known Allergies)    Family History  Problem Relation Age of Onset  . Colon cancer Neg Hx     History   Social History  . Marital Status: Married    Spouse Name: N/A  . Number of Children: N/A  . Years of Education: N/A   Occupational History  . Not on file.   Social History Main Topics  . Smoking  status: Never Smoker   . Smokeless tobacco: Not on file  . Alcohol Use: No  . Drug Use: No  . Sexual Activity: Not on file   Other Topics Concern  . Not on file   Social History Narrative    Review of Systems: Negative unless mentioned in HPI.   Physical Exam: BP 159/76 mmHg  Pulse 62  Temp(Src) 97.4 F (36.3 C) (Oral)  Ht 4\' 11"  (1.499 m)  Wt 121 lb (54.885 kg)  BMI 24.43 kg/m2 General:   Alert and oriented. Pleasant and cooperative. Well-nourished and well-developed.  Head:  Normocephalic and atraumatic. Eyes:  Without icterus, sclera clear and conjunctiva pink.  Ears:  Normal auditory acuity. Nose:  No deformity, discharge,  or lesions. Mouth:  No deformity or lesions, oral mucosa pink.  Lungs:  Clear to auscultation bilaterally. No wheezes, rales, or rhonchi. No distress.  Heart:  S1, S2 present without murmurs appreciated.  Abdomen:  +BS, soft, non-tender and non-distended. No HSM noted. No guarding or rebound. No masses appreciated.  Rectal:  Deferred  Msk:  Symmetrical without gross deformities. Normal posture. Extremities:  Without  edema. Neurologic:  Alert and  oriented x4;  grossly normal neurologically. Skin:  Intact without significant lesions or rashes. Psych:  Alert and cooperative. Normal mood and affect.

## 2014-11-25 NOTE — Patient Instructions (Signed)
I have requested blood work from your doctor.  Please complete the stool sample. If it is positive for blood we can't see, we need to consider an updated colonoscopy.

## 2014-12-04 ENCOUNTER — Encounter: Payer: Self-pay | Admitting: Gastroenterology

## 2014-12-04 NOTE — Assessment & Plan Note (Signed)
Self-limiting. Likely gastroenteritis. Ifobt as planned.

## 2014-12-04 NOTE — Assessment & Plan Note (Signed)
69 year old female with self-limiting episode of nausea, vomiting, and diarrhea, now resolved. Likely gastroenteritis. Associated reports of "black" stool but without bright red blood. No further blood noted. Unclear if true melena. Will request outside labs and obtain ifobt. If positive, consider updated colonoscopy +/- EGD with Dr. Darrick Penna. Symptoms resolved at time of visit.

## 2014-12-28 NOTE — Progress Notes (Signed)
cc'ed to pcp °

## 2015-03-17 DIAGNOSIS — Z6825 Body mass index (BMI) 25.0-25.9, adult: Secondary | ICD-10-CM | POA: Diagnosis not present

## 2015-03-17 DIAGNOSIS — I10 Essential (primary) hypertension: Secondary | ICD-10-CM | POA: Diagnosis not present

## 2015-03-17 DIAGNOSIS — K210 Gastro-esophageal reflux disease with esophagitis: Secondary | ICD-10-CM | POA: Diagnosis not present

## 2015-05-16 DIAGNOSIS — J208 Acute bronchitis due to other specified organisms: Secondary | ICD-10-CM | POA: Diagnosis not present

## 2015-12-21 DIAGNOSIS — Z6825 Body mass index (BMI) 25.0-25.9, adult: Secondary | ICD-10-CM | POA: Diagnosis not present

## 2015-12-21 DIAGNOSIS — J3089 Other allergic rhinitis: Secondary | ICD-10-CM | POA: Diagnosis not present

## 2015-12-21 DIAGNOSIS — I10 Essential (primary) hypertension: Secondary | ICD-10-CM | POA: Diagnosis not present

## 2016-03-28 DIAGNOSIS — I10 Essential (primary) hypertension: Secondary | ICD-10-CM | POA: Diagnosis not present

## 2016-03-28 DIAGNOSIS — J028 Acute pharyngitis due to other specified organisms: Secondary | ICD-10-CM | POA: Diagnosis not present

## 2016-03-28 DIAGNOSIS — Z6825 Body mass index (BMI) 25.0-25.9, adult: Secondary | ICD-10-CM | POA: Diagnosis not present

## 2016-03-28 DIAGNOSIS — J3089 Other allergic rhinitis: Secondary | ICD-10-CM | POA: Diagnosis not present

## 2016-07-04 DIAGNOSIS — I10 Essential (primary) hypertension: Secondary | ICD-10-CM | POA: Diagnosis not present

## 2016-07-04 DIAGNOSIS — Z6825 Body mass index (BMI) 25.0-25.9, adult: Secondary | ICD-10-CM | POA: Diagnosis not present

## 2016-07-04 DIAGNOSIS — J3089 Other allergic rhinitis: Secondary | ICD-10-CM | POA: Diagnosis not present

## 2017-02-13 DIAGNOSIS — J3089 Other allergic rhinitis: Secondary | ICD-10-CM | POA: Diagnosis not present

## 2017-02-13 DIAGNOSIS — K219 Gastro-esophageal reflux disease without esophagitis: Secondary | ICD-10-CM | POA: Diagnosis not present

## 2017-02-13 DIAGNOSIS — I10 Essential (primary) hypertension: Secondary | ICD-10-CM | POA: Diagnosis not present

## 2017-02-13 DIAGNOSIS — Z6826 Body mass index (BMI) 26.0-26.9, adult: Secondary | ICD-10-CM | POA: Diagnosis not present

## 2017-08-27 DIAGNOSIS — Z6825 Body mass index (BMI) 25.0-25.9, adult: Secondary | ICD-10-CM | POA: Diagnosis not present

## 2017-08-27 DIAGNOSIS — Z6826 Body mass index (BMI) 26.0-26.9, adult: Secondary | ICD-10-CM | POA: Diagnosis not present

## 2017-08-27 DIAGNOSIS — I10 Essential (primary) hypertension: Secondary | ICD-10-CM | POA: Diagnosis not present

## 2017-08-27 DIAGNOSIS — J3089 Other allergic rhinitis: Secondary | ICD-10-CM | POA: Diagnosis not present

## 2017-08-27 DIAGNOSIS — K219 Gastro-esophageal reflux disease without esophagitis: Secondary | ICD-10-CM | POA: Diagnosis not present

## 2017-09-29 DIAGNOSIS — Z6826 Body mass index (BMI) 26.0-26.9, adult: Secondary | ICD-10-CM | POA: Diagnosis not present

## 2017-09-29 DIAGNOSIS — N3001 Acute cystitis with hematuria: Secondary | ICD-10-CM | POA: Diagnosis not present

## 2017-12-23 DIAGNOSIS — Z0000 Encounter for general adult medical examination without abnormal findings: Secondary | ICD-10-CM | POA: Diagnosis not present

## 2017-12-23 DIAGNOSIS — Z6825 Body mass index (BMI) 25.0-25.9, adult: Secondary | ICD-10-CM | POA: Diagnosis not present

## 2018-01-13 DIAGNOSIS — Z1231 Encounter for screening mammogram for malignant neoplasm of breast: Secondary | ICD-10-CM | POA: Diagnosis not present

## 2018-01-19 DIAGNOSIS — M810 Age-related osteoporosis without current pathological fracture: Secondary | ICD-10-CM | POA: Diagnosis not present

## 2018-01-19 DIAGNOSIS — Z780 Asymptomatic menopausal state: Secondary | ICD-10-CM | POA: Diagnosis not present

## 2018-01-19 DIAGNOSIS — M85851 Other specified disorders of bone density and structure, right thigh: Secondary | ICD-10-CM | POA: Diagnosis not present

## 2018-04-07 DIAGNOSIS — Z6826 Body mass index (BMI) 26.0-26.9, adult: Secondary | ICD-10-CM | POA: Diagnosis not present

## 2018-04-07 DIAGNOSIS — I10 Essential (primary) hypertension: Secondary | ICD-10-CM | POA: Diagnosis not present

## 2018-04-07 DIAGNOSIS — M810 Age-related osteoporosis without current pathological fracture: Secondary | ICD-10-CM | POA: Diagnosis not present

## 2018-07-20 DIAGNOSIS — I10 Essential (primary) hypertension: Secondary | ICD-10-CM | POA: Diagnosis not present

## 2018-07-20 DIAGNOSIS — K529 Noninfective gastroenteritis and colitis, unspecified: Secondary | ICD-10-CM | POA: Diagnosis not present

## 2018-07-20 DIAGNOSIS — M810 Age-related osteoporosis without current pathological fracture: Secondary | ICD-10-CM | POA: Diagnosis not present

## 2018-07-20 DIAGNOSIS — Z6825 Body mass index (BMI) 25.0-25.9, adult: Secondary | ICD-10-CM | POA: Diagnosis not present

## 2018-11-16 NOTE — Progress Notes (Signed)
REVIEWED-NO ADDITIONAL RECOMMENDATIONS. 

## 2018-11-19 DIAGNOSIS — J029 Acute pharyngitis, unspecified: Secondary | ICD-10-CM | POA: Diagnosis not present

## 2018-11-19 DIAGNOSIS — Z6826 Body mass index (BMI) 26.0-26.9, adult: Secondary | ICD-10-CM | POA: Diagnosis not present

## 2018-11-19 DIAGNOSIS — I10 Essential (primary) hypertension: Secondary | ICD-10-CM | POA: Diagnosis not present

## 2018-11-19 DIAGNOSIS — M810 Age-related osteoporosis without current pathological fracture: Secondary | ICD-10-CM | POA: Diagnosis not present

## 2019-01-04 DIAGNOSIS — H81399 Other peripheral vertigo, unspecified ear: Secondary | ICD-10-CM | POA: Diagnosis not present

## 2019-01-04 DIAGNOSIS — Z6825 Body mass index (BMI) 25.0-25.9, adult: Secondary | ICD-10-CM | POA: Diagnosis not present

## 2019-03-15 DIAGNOSIS — Z6826 Body mass index (BMI) 26.0-26.9, adult: Secondary | ICD-10-CM | POA: Diagnosis not present

## 2019-03-15 DIAGNOSIS — H81399 Other peripheral vertigo, unspecified ear: Secondary | ICD-10-CM | POA: Diagnosis not present

## 2019-04-15 ENCOUNTER — Encounter: Payer: Self-pay | Admitting: Gastroenterology

## 2019-07-07 ENCOUNTER — Other Ambulatory Visit: Payer: Self-pay

## 2019-07-07 ENCOUNTER — Ambulatory Visit: Payer: BC Managed Care – PPO | Attending: Internal Medicine

## 2019-07-07 DIAGNOSIS — Z20822 Contact with and (suspected) exposure to covid-19: Secondary | ICD-10-CM

## 2019-07-07 DIAGNOSIS — Z20828 Contact with and (suspected) exposure to other viral communicable diseases: Secondary | ICD-10-CM | POA: Diagnosis not present

## 2019-07-08 LAB — NOVEL CORONAVIRUS, NAA: SARS-CoV-2, NAA: DETECTED — AB

## 2019-07-09 ENCOUNTER — Telehealth: Payer: Self-pay | Admitting: Nurse Practitioner

## 2019-07-09 NOTE — Telephone Encounter (Signed)
Called to Discuss with patient about Covid symptoms and the use of bamlanivimab, a monoclonal antibody infusion for those with mild to moderate Covid symptoms and at a high risk of hospitalization.     Pt is qualified for this infusion at the [facility] infusion center due to co-morbid conditions and/or a member of an at-risk group.     Patient Active Problem List   Diagnosis Date Noted  . Diarrhea 11/25/2014  . BILIARY DYSKINESIA 04/11/2009  . GERD 04/10/2009  . NAUSEA AND VOMITING 04/10/2009  . EPIGASTRIC PAIN 04/10/2009    Patient states that she is not having any symptoms at this time.

## 2019-07-27 DIAGNOSIS — Z6825 Body mass index (BMI) 25.0-25.9, adult: Secondary | ICD-10-CM | POA: Diagnosis not present

## 2019-07-27 DIAGNOSIS — U071 COVID-19: Secondary | ICD-10-CM | POA: Diagnosis not present

## 2019-08-04 DIAGNOSIS — Z5321 Procedure and treatment not carried out due to patient leaving prior to being seen by health care provider: Secondary | ICD-10-CM | POA: Diagnosis not present

## 2019-08-04 DIAGNOSIS — R109 Unspecified abdominal pain: Secondary | ICD-10-CM | POA: Diagnosis not present

## 2019-08-05 DIAGNOSIS — J90 Pleural effusion, not elsewhere classified: Secondary | ICD-10-CM | POA: Diagnosis not present

## 2019-08-05 DIAGNOSIS — K5730 Diverticulosis of large intestine without perforation or abscess without bleeding: Secondary | ICD-10-CM | POA: Diagnosis not present

## 2019-08-05 DIAGNOSIS — K5652 Intestinal adhesions [bands] with complete obstruction: Secondary | ICD-10-CM | POA: Diagnosis present

## 2019-08-05 DIAGNOSIS — K529 Noninfective gastroenteritis and colitis, unspecified: Secondary | ICD-10-CM | POA: Diagnosis not present

## 2019-08-05 DIAGNOSIS — Z452 Encounter for adjustment and management of vascular access device: Secondary | ICD-10-CM | POA: Diagnosis not present

## 2019-08-05 DIAGNOSIS — K565 Intestinal adhesions [bands], unspecified as to partial versus complete obstruction: Secondary | ICD-10-CM | POA: Diagnosis not present

## 2019-08-05 DIAGNOSIS — E119 Type 2 diabetes mellitus without complications: Secondary | ICD-10-CM | POA: Diagnosis not present

## 2019-08-05 DIAGNOSIS — Z6836 Body mass index (BMI) 36.0-36.9, adult: Secondary | ICD-10-CM | POA: Diagnosis not present

## 2019-08-05 DIAGNOSIS — M069 Rheumatoid arthritis, unspecified: Secondary | ICD-10-CM | POA: Diagnosis not present

## 2019-08-05 DIAGNOSIS — E876 Hypokalemia: Secondary | ICD-10-CM | POA: Diagnosis not present

## 2019-08-05 DIAGNOSIS — Z20822 Contact with and (suspected) exposure to covid-19: Secondary | ICD-10-CM | POA: Diagnosis present

## 2019-08-05 DIAGNOSIS — K567 Ileus, unspecified: Secondary | ICD-10-CM | POA: Diagnosis not present

## 2019-08-05 DIAGNOSIS — E440 Moderate protein-calorie malnutrition: Secondary | ICD-10-CM | POA: Diagnosis not present

## 2019-08-05 DIAGNOSIS — R109 Unspecified abdominal pain: Secondary | ICD-10-CM | POA: Diagnosis not present

## 2019-08-05 DIAGNOSIS — K219 Gastro-esophageal reflux disease without esophagitis: Secondary | ICD-10-CM | POA: Diagnosis not present

## 2019-08-05 DIAGNOSIS — K56609 Unspecified intestinal obstruction, unspecified as to partial versus complete obstruction: Secondary | ICD-10-CM | POA: Diagnosis not present

## 2019-08-06 DIAGNOSIS — K219 Gastro-esophageal reflux disease without esophagitis: Secondary | ICD-10-CM | POA: Diagnosis not present

## 2019-08-06 DIAGNOSIS — E440 Moderate protein-calorie malnutrition: Secondary | ICD-10-CM | POA: Diagnosis not present

## 2019-08-06 DIAGNOSIS — K529 Noninfective gastroenteritis and colitis, unspecified: Secondary | ICD-10-CM | POA: Diagnosis not present

## 2019-08-06 DIAGNOSIS — K565 Intestinal adhesions [bands], unspecified as to partial versus complete obstruction: Secondary | ICD-10-CM | POA: Diagnosis not present

## 2019-08-06 DIAGNOSIS — E876 Hypokalemia: Secondary | ICD-10-CM | POA: Diagnosis not present

## 2019-08-07 DIAGNOSIS — R109 Unspecified abdominal pain: Secondary | ICD-10-CM | POA: Diagnosis not present

## 2019-08-09 DIAGNOSIS — R109 Unspecified abdominal pain: Secondary | ICD-10-CM | POA: Diagnosis not present

## 2019-08-09 DIAGNOSIS — K56609 Unspecified intestinal obstruction, unspecified as to partial versus complete obstruction: Secondary | ICD-10-CM | POA: Diagnosis not present

## 2019-08-10 DIAGNOSIS — J90 Pleural effusion, not elsewhere classified: Secondary | ICD-10-CM | POA: Diagnosis not present

## 2019-08-10 DIAGNOSIS — M069 Rheumatoid arthritis, unspecified: Secondary | ICD-10-CM | POA: Diagnosis not present

## 2019-08-10 DIAGNOSIS — Z452 Encounter for adjustment and management of vascular access device: Secondary | ICD-10-CM | POA: Diagnosis not present

## 2019-08-10 DIAGNOSIS — K56609 Unspecified intestinal obstruction, unspecified as to partial versus complete obstruction: Secondary | ICD-10-CM | POA: Diagnosis not present

## 2019-08-10 DIAGNOSIS — K565 Intestinal adhesions [bands], unspecified as to partial versus complete obstruction: Secondary | ICD-10-CM | POA: Diagnosis not present

## 2019-08-10 DIAGNOSIS — E119 Type 2 diabetes mellitus without complications: Secondary | ICD-10-CM | POA: Diagnosis not present

## 2019-08-10 DIAGNOSIS — K219 Gastro-esophageal reflux disease without esophagitis: Secondary | ICD-10-CM | POA: Diagnosis not present

## 2019-08-12 DIAGNOSIS — K567 Ileus, unspecified: Secondary | ICD-10-CM | POA: Diagnosis not present

## 2019-08-15 DIAGNOSIS — K219 Gastro-esophageal reflux disease without esophagitis: Secondary | ICD-10-CM | POA: Diagnosis not present

## 2019-08-15 DIAGNOSIS — K529 Noninfective gastroenteritis and colitis, unspecified: Secondary | ICD-10-CM | POA: Diagnosis not present

## 2019-08-15 DIAGNOSIS — E876 Hypokalemia: Secondary | ICD-10-CM | POA: Diagnosis not present

## 2019-08-15 DIAGNOSIS — K565 Intestinal adhesions [bands], unspecified as to partial versus complete obstruction: Secondary | ICD-10-CM | POA: Diagnosis not present

## 2019-08-15 DIAGNOSIS — E440 Moderate protein-calorie malnutrition: Secondary | ICD-10-CM | POA: Diagnosis not present

## 2019-08-17 DIAGNOSIS — Z6824 Body mass index (BMI) 24.0-24.9, adult: Secondary | ICD-10-CM | POA: Diagnosis not present

## 2019-08-17 DIAGNOSIS — K56699 Other intestinal obstruction unspecified as to partial versus complete obstruction: Secondary | ICD-10-CM | POA: Diagnosis not present

## 2019-09-30 DIAGNOSIS — Z23 Encounter for immunization: Secondary | ICD-10-CM | POA: Diagnosis not present

## 2019-10-04 DIAGNOSIS — Z6824 Body mass index (BMI) 24.0-24.9, adult: Secondary | ICD-10-CM | POA: Diagnosis not present

## 2019-10-04 DIAGNOSIS — K56699 Other intestinal obstruction unspecified as to partial versus complete obstruction: Secondary | ICD-10-CM | POA: Diagnosis not present

## 2019-10-27 DIAGNOSIS — Z23 Encounter for immunization: Secondary | ICD-10-CM | POA: Diagnosis not present

## 2019-11-18 DIAGNOSIS — R008 Other abnormalities of heart beat: Secondary | ICD-10-CM | POA: Diagnosis not present

## 2019-12-23 DIAGNOSIS — R1084 Generalized abdominal pain: Secondary | ICD-10-CM | POA: Diagnosis not present

## 2019-12-23 DIAGNOSIS — M810 Age-related osteoporosis without current pathological fracture: Secondary | ICD-10-CM | POA: Diagnosis not present

## 2019-12-23 DIAGNOSIS — Z6824 Body mass index (BMI) 24.0-24.9, adult: Secondary | ICD-10-CM | POA: Diagnosis not present

## 2019-12-23 DIAGNOSIS — I10 Essential (primary) hypertension: Secondary | ICD-10-CM | POA: Diagnosis not present

## 2019-12-24 DIAGNOSIS — R109 Unspecified abdominal pain: Secondary | ICD-10-CM | POA: Diagnosis not present

## 2019-12-24 DIAGNOSIS — K449 Diaphragmatic hernia without obstruction or gangrene: Secondary | ICD-10-CM | POA: Diagnosis not present

## 2020-02-16 DIAGNOSIS — H2513 Age-related nuclear cataract, bilateral: Secondary | ICD-10-CM | POA: Diagnosis not present

## 2020-02-16 DIAGNOSIS — H401134 Primary open-angle glaucoma, bilateral, indeterminate stage: Secondary | ICD-10-CM | POA: Diagnosis not present

## 2020-04-21 DIAGNOSIS — H401133 Primary open-angle glaucoma, bilateral, severe stage: Secondary | ICD-10-CM | POA: Diagnosis not present

## 2020-06-28 DIAGNOSIS — I10 Essential (primary) hypertension: Secondary | ICD-10-CM | POA: Diagnosis not present

## 2020-06-28 DIAGNOSIS — Z6825 Body mass index (BMI) 25.0-25.9, adult: Secondary | ICD-10-CM | POA: Diagnosis not present

## 2020-06-28 DIAGNOSIS — M810 Age-related osteoporosis without current pathological fracture: Secondary | ICD-10-CM | POA: Diagnosis not present

## 2020-06-28 DIAGNOSIS — K219 Gastro-esophageal reflux disease without esophagitis: Secondary | ICD-10-CM | POA: Diagnosis not present

## 2020-07-18 DIAGNOSIS — Z6825 Body mass index (BMI) 25.0-25.9, adult: Secondary | ICD-10-CM | POA: Diagnosis not present

## 2020-07-18 DIAGNOSIS — R51 Headache with orthostatic component, not elsewhere classified: Secondary | ICD-10-CM | POA: Diagnosis not present

## 2020-10-05 DIAGNOSIS — M4317 Spondylolisthesis, lumbosacral region: Secondary | ICD-10-CM | POA: Diagnosis not present

## 2020-10-05 DIAGNOSIS — K449 Diaphragmatic hernia without obstruction or gangrene: Secondary | ICD-10-CM | POA: Diagnosis not present

## 2020-10-05 DIAGNOSIS — K5730 Diverticulosis of large intestine without perforation or abscess without bleeding: Secondary | ICD-10-CM | POA: Diagnosis not present

## 2020-10-05 DIAGNOSIS — N3000 Acute cystitis without hematuria: Secondary | ICD-10-CM | POA: Diagnosis not present

## 2020-10-05 DIAGNOSIS — N281 Cyst of kidney, acquired: Secondary | ICD-10-CM | POA: Diagnosis not present

## 2020-10-05 DIAGNOSIS — R197 Diarrhea, unspecified: Secondary | ICD-10-CM | POA: Diagnosis not present

## 2020-10-05 DIAGNOSIS — K5900 Constipation, unspecified: Secondary | ICD-10-CM | POA: Diagnosis not present

## 2020-10-05 DIAGNOSIS — R109 Unspecified abdominal pain: Secondary | ICD-10-CM | POA: Diagnosis not present

## 2020-10-05 DIAGNOSIS — R110 Nausea: Secondary | ICD-10-CM | POA: Diagnosis not present

## 2020-10-05 DIAGNOSIS — R112 Nausea with vomiting, unspecified: Secondary | ICD-10-CM | POA: Diagnosis not present

## 2020-10-05 DIAGNOSIS — R1110 Vomiting, unspecified: Secondary | ICD-10-CM | POA: Diagnosis not present

## 2020-10-05 DIAGNOSIS — I700 Atherosclerosis of aorta: Secondary | ICD-10-CM | POA: Diagnosis not present

## 2020-10-09 DIAGNOSIS — Z6825 Body mass index (BMI) 25.0-25.9, adult: Secondary | ICD-10-CM | POA: Diagnosis not present

## 2020-10-09 DIAGNOSIS — K520 Gastroenteritis and colitis due to radiation: Secondary | ICD-10-CM | POA: Diagnosis not present

## 2020-10-18 DIAGNOSIS — Z6825 Body mass index (BMI) 25.0-25.9, adult: Secondary | ICD-10-CM | POA: Diagnosis not present

## 2020-10-18 DIAGNOSIS — J112 Influenza due to unidentified influenza virus with gastrointestinal manifestations: Secondary | ICD-10-CM | POA: Diagnosis not present

## 2021-01-29 DIAGNOSIS — Z6825 Body mass index (BMI) 25.0-25.9, adult: Secondary | ICD-10-CM | POA: Diagnosis not present

## 2021-01-29 DIAGNOSIS — Z0000 Encounter for general adult medical examination without abnormal findings: Secondary | ICD-10-CM | POA: Diagnosis not present

## 2021-01-29 DIAGNOSIS — M810 Age-related osteoporosis without current pathological fracture: Secondary | ICD-10-CM | POA: Diagnosis not present

## 2021-01-29 DIAGNOSIS — I10 Essential (primary) hypertension: Secondary | ICD-10-CM | POA: Diagnosis not present

## 2021-02-12 ENCOUNTER — Encounter: Payer: Self-pay | Admitting: Internal Medicine

## 2021-04-16 ENCOUNTER — Ambulatory Visit: Payer: BC Managed Care – PPO

## 2021-04-16 ENCOUNTER — Encounter: Payer: Self-pay | Admitting: Internal Medicine

## 2021-04-30 DIAGNOSIS — I10 Essential (primary) hypertension: Secondary | ICD-10-CM | POA: Diagnosis not present

## 2021-04-30 DIAGNOSIS — K219 Gastro-esophageal reflux disease without esophagitis: Secondary | ICD-10-CM | POA: Diagnosis not present

## 2021-04-30 DIAGNOSIS — Z6825 Body mass index (BMI) 25.0-25.9, adult: Secondary | ICD-10-CM | POA: Diagnosis not present

## 2021-05-21 DIAGNOSIS — Z1231 Encounter for screening mammogram for malignant neoplasm of breast: Secondary | ICD-10-CM | POA: Diagnosis not present

## 2021-07-09 DIAGNOSIS — I10 Essential (primary) hypertension: Secondary | ICD-10-CM | POA: Diagnosis not present

## 2021-08-07 DIAGNOSIS — I110 Hypertensive heart disease with heart failure: Secondary | ICD-10-CM | POA: Diagnosis not present

## 2021-10-09 DIAGNOSIS — I10 Essential (primary) hypertension: Secondary | ICD-10-CM | POA: Diagnosis not present

## 2021-10-09 DIAGNOSIS — Z6824 Body mass index (BMI) 24.0-24.9, adult: Secondary | ICD-10-CM | POA: Diagnosis not present

## 2021-10-09 DIAGNOSIS — M545 Low back pain, unspecified: Secondary | ICD-10-CM | POA: Diagnosis not present

## 2021-10-09 DIAGNOSIS — K219 Gastro-esophageal reflux disease without esophagitis: Secondary | ICD-10-CM | POA: Diagnosis not present

## 2022-11-05 DIAGNOSIS — I10 Essential (primary) hypertension: Secondary | ICD-10-CM | POA: Diagnosis not present

## 2022-11-05 DIAGNOSIS — K219 Gastro-esophageal reflux disease without esophagitis: Secondary | ICD-10-CM | POA: Diagnosis not present

## 2022-11-05 DIAGNOSIS — M545 Low back pain, unspecified: Secondary | ICD-10-CM | POA: Diagnosis not present

## 2023-02-04 DIAGNOSIS — M545 Low back pain, unspecified: Secondary | ICD-10-CM | POA: Diagnosis not present

## 2023-02-04 DIAGNOSIS — Z1331 Encounter for screening for depression: Secondary | ICD-10-CM | POA: Diagnosis not present

## 2023-02-04 DIAGNOSIS — K219 Gastro-esophageal reflux disease without esophagitis: Secondary | ICD-10-CM | POA: Diagnosis not present

## 2023-02-04 DIAGNOSIS — I10 Essential (primary) hypertension: Secondary | ICD-10-CM | POA: Diagnosis not present

## 2023-02-04 DIAGNOSIS — Z0000 Encounter for general adult medical examination without abnormal findings: Secondary | ICD-10-CM | POA: Diagnosis not present

## 2023-05-07 DIAGNOSIS — I10 Essential (primary) hypertension: Secondary | ICD-10-CM | POA: Diagnosis not present

## 2023-05-07 DIAGNOSIS — M545 Low back pain, unspecified: Secondary | ICD-10-CM | POA: Diagnosis not present

## 2023-05-07 DIAGNOSIS — K219 Gastro-esophageal reflux disease without esophagitis: Secondary | ICD-10-CM | POA: Diagnosis not present
